# Patient Record
Sex: Male | Born: 1980
Health system: Southern US, Community
[De-identification: ages and names within clinical notes are randomized; demographics above are authoritative.]

---

## 2000-08-28 ENCOUNTER — Emergency Department (HOSPITAL_COMMUNITY): Admission: EM | Admit: 2000-08-28 | Discharge: 2000-08-28 | Payer: Self-pay | Admitting: *Deleted

## 2004-04-11 ENCOUNTER — Ambulatory Visit (HOSPITAL_COMMUNITY): Admission: RE | Admit: 2004-04-11 | Discharge: 2004-04-11 | Payer: Self-pay | Admitting: Family Medicine

## 2004-09-13 ENCOUNTER — Ambulatory Visit: Payer: Self-pay | Admitting: Family Medicine

## 2004-11-09 ENCOUNTER — Emergency Department (HOSPITAL_COMMUNITY): Admission: EM | Admit: 2004-11-09 | Discharge: 2004-11-09 | Payer: Self-pay | Admitting: Emergency Medicine

## 2005-02-18 ENCOUNTER — Emergency Department (HOSPITAL_COMMUNITY): Admission: EM | Admit: 2005-02-18 | Discharge: 2005-02-18 | Payer: Self-pay | Admitting: Emergency Medicine

## 2005-08-17 ENCOUNTER — Emergency Department (HOSPITAL_COMMUNITY): Admission: EM | Admit: 2005-08-17 | Discharge: 2005-08-17 | Payer: Self-pay | Admitting: Emergency Medicine

## 2005-10-06 ENCOUNTER — Emergency Department (HOSPITAL_COMMUNITY): Admission: EM | Admit: 2005-10-06 | Discharge: 2005-10-06 | Payer: Self-pay | Admitting: Family Medicine

## 2005-12-03 ENCOUNTER — Emergency Department (HOSPITAL_COMMUNITY): Admission: EM | Admit: 2005-12-03 | Discharge: 2005-12-03 | Payer: Self-pay | Admitting: Emergency Medicine

## 2006-03-28 ENCOUNTER — Emergency Department (HOSPITAL_COMMUNITY): Admission: EM | Admit: 2006-03-28 | Discharge: 2006-03-28 | Payer: Self-pay | Admitting: Emergency Medicine

## 2006-10-13 ENCOUNTER — Emergency Department (HOSPITAL_COMMUNITY): Admission: EM | Admit: 2006-10-13 | Discharge: 2006-10-13 | Payer: Self-pay | Admitting: Family Medicine

## 2006-10-15 ENCOUNTER — Emergency Department (HOSPITAL_COMMUNITY): Admission: EM | Admit: 2006-10-15 | Discharge: 2006-10-15 | Payer: Self-pay | Admitting: Family Medicine

## 2006-10-19 ENCOUNTER — Emergency Department (HOSPITAL_COMMUNITY): Admission: EM | Admit: 2006-10-19 | Discharge: 2006-10-19 | Payer: Self-pay | Admitting: Family Medicine

## 2006-11-12 ENCOUNTER — Emergency Department (HOSPITAL_COMMUNITY): Admission: EM | Admit: 2006-11-12 | Discharge: 2006-11-12 | Payer: Self-pay | Admitting: Family Medicine

## 2006-12-23 ENCOUNTER — Emergency Department (HOSPITAL_COMMUNITY): Admission: EM | Admit: 2006-12-23 | Discharge: 2006-12-23 | Payer: Self-pay | Admitting: Family Medicine

## 2007-08-18 ENCOUNTER — Encounter: Payer: Self-pay | Admitting: Internal Medicine

## 2010-04-10 ENCOUNTER — Emergency Department (HOSPITAL_COMMUNITY): Admission: EM | Admit: 2010-04-10 | Discharge: 2010-04-10 | Payer: Self-pay | Admitting: Emergency Medicine

## 2010-12-15 ENCOUNTER — Emergency Department (INDEPENDENT_AMBULATORY_CARE_PROVIDER_SITE_OTHER): Payer: No Typology Code available for payment source

## 2010-12-15 ENCOUNTER — Emergency Department (HOSPITAL_BASED_OUTPATIENT_CLINIC_OR_DEPARTMENT_OTHER)
Admission: EM | Admit: 2010-12-15 | Discharge: 2010-12-15 | Disposition: A | Discharge status: Home / Self Care | Payer: No Typology Code available for payment source | Attending: Emergency Medicine | Admitting: Emergency Medicine

## 2010-12-15 DIAGNOSIS — Z043 Encounter for examination and observation following other accident: Secondary | ICD-10-CM

## 2010-12-15 DIAGNOSIS — R05 Cough: Secondary | ICD-10-CM

## 2010-12-15 DIAGNOSIS — S81009A Unspecified open wound, unspecified knee, initial encounter: Secondary | ICD-10-CM | POA: Insufficient documentation

## 2010-12-15 DIAGNOSIS — R51 Headache: Secondary | ICD-10-CM | POA: Insufficient documentation

## 2010-12-15 DIAGNOSIS — Y92410 Unspecified street and highway as the place of occurrence of the external cause: Secondary | ICD-10-CM | POA: Insufficient documentation

## 2010-12-15 DIAGNOSIS — R059 Cough, unspecified: Secondary | ICD-10-CM | POA: Insufficient documentation

## 2010-12-15 DIAGNOSIS — R000 Tachycardia, unspecified: Secondary | ICD-10-CM

## 2010-12-15 DIAGNOSIS — R55 Syncope and collapse: Secondary | ICD-10-CM | POA: Insufficient documentation

## 2011-01-03 ENCOUNTER — Emergency Department (HOSPITAL_BASED_OUTPATIENT_CLINIC_OR_DEPARTMENT_OTHER)
Admission: EM | Admit: 2011-01-03 | Discharge: 2011-01-03 | Disposition: A | Discharge status: Home / Self Care | Payer: No Typology Code available for payment source | Attending: Emergency Medicine | Admitting: Emergency Medicine

## 2011-01-03 DIAGNOSIS — Z4802 Encounter for removal of sutures: Secondary | ICD-10-CM | POA: Insufficient documentation

## 2011-05-28 ENCOUNTER — Emergency Department (HOSPITAL_BASED_OUTPATIENT_CLINIC_OR_DEPARTMENT_OTHER)
Admission: EM | Admit: 2011-05-28 | Discharge: 2011-05-28 | Disposition: A | Discharge status: Home / Self Care | Payer: No Typology Code available for payment source | Attending: Emergency Medicine | Admitting: Emergency Medicine

## 2011-05-28 ENCOUNTER — Encounter: Payer: Self-pay | Admitting: *Deleted

## 2011-05-28 DIAGNOSIS — M545 Low back pain, unspecified: Secondary | ICD-10-CM | POA: Insufficient documentation

## 2011-05-28 DIAGNOSIS — Y92410 Unspecified street and highway as the place of occurrence of the external cause: Secondary | ICD-10-CM | POA: Insufficient documentation

## 2011-05-28 DIAGNOSIS — S39012A Strain of muscle, fascia and tendon of lower back, initial encounter: Secondary | ICD-10-CM

## 2011-05-28 MED ORDER — CYCLOBENZAPRINE HCL 10 MG PO TABS: 10.0000 mg | ORAL_TABLET | Freq: Two times a day (BID) | ORAL | Status: AC | PRN

## 2011-05-28 NOTE — ED Provider Notes (Signed)
Medical screening examination/treatment/procedure(s) were performed by non-physician practitioner and as supervising physician I was immediately available for consultation/collaboration.   Tedi Hughson B. Bernette Mayers, MD 05/28/11 1815
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
## 2014-12-08 ENCOUNTER — Emergency Department (HOSPITAL_BASED_OUTPATIENT_CLINIC_OR_DEPARTMENT_OTHER)
Admission: EM | Admit: 2014-12-08 | Discharge: 2014-12-09 | Disposition: A | Discharge status: Home / Self Care | Payer: Self-pay | Attending: Emergency Medicine | Admitting: Emergency Medicine

## 2014-12-08 ENCOUNTER — Encounter (HOSPITAL_BASED_OUTPATIENT_CLINIC_OR_DEPARTMENT_OTHER): Payer: Self-pay

## 2014-12-08 DIAGNOSIS — S4991XA Unspecified injury of right shoulder and upper arm, initial encounter: Secondary | ICD-10-CM | POA: Insufficient documentation

## 2014-12-08 DIAGNOSIS — S3992XA Unspecified injury of lower back, initial encounter: Secondary | ICD-10-CM | POA: Insufficient documentation

## 2014-12-08 DIAGNOSIS — Y92410 Unspecified street and highway as the place of occurrence of the external cause: Secondary | ICD-10-CM | POA: Insufficient documentation

## 2014-12-08 DIAGNOSIS — Y998 Other external cause status: Secondary | ICD-10-CM | POA: Insufficient documentation

## 2014-12-08 DIAGNOSIS — Y9389 Activity, other specified: Secondary | ICD-10-CM | POA: Insufficient documentation
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
## 2014-12-09 MED ORDER — HYDROCODONE-ACETAMINOPHEN 5-325 MG PO TABS: 1.0000 | ORAL_TABLET | Freq: Four times a day (QID) | ORAL | Status: DC | PRN
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
## 2014-12-09 NOTE — Discharge Instructions (Signed)

## 2015-02-13 ENCOUNTER — Emergency Department (HOSPITAL_BASED_OUTPATIENT_CLINIC_OR_DEPARTMENT_OTHER)
Admission: EM | Admit: 2015-02-13 | Discharge: 2015-02-13 | Disposition: A | Discharge status: Home / Self Care | Payer: No Typology Code available for payment source | Attending: Emergency Medicine | Admitting: Emergency Medicine

## 2015-02-13 ENCOUNTER — Encounter (HOSPITAL_BASED_OUTPATIENT_CLINIC_OR_DEPARTMENT_OTHER): Payer: Self-pay | Admitting: Emergency Medicine

## 2015-02-13 ENCOUNTER — Emergency Department (HOSPITAL_BASED_OUTPATIENT_CLINIC_OR_DEPARTMENT_OTHER): Payer: No Typology Code available for payment source

## 2015-02-13 DIAGNOSIS — S6992XA Unspecified injury of left wrist, hand and finger(s), initial encounter: Secondary | ICD-10-CM | POA: Insufficient documentation

## 2015-02-13 DIAGNOSIS — M79642 Pain in left hand: Secondary | ICD-10-CM

## 2015-02-13 DIAGNOSIS — Y9389 Activity, other specified: Secondary | ICD-10-CM | POA: Diagnosis not present

## 2015-02-13 DIAGNOSIS — Y92410 Unspecified street and highway as the place of occurrence of the external cause: Secondary | ICD-10-CM | POA: Diagnosis not present

## 2015-02-13 DIAGNOSIS — Y998 Other external cause status: Secondary | ICD-10-CM | POA: Diagnosis not present

## 2015-02-13 MED ORDER — IBUPROFEN 600 MG PO TABS: 600.0000 mg | ORAL_TABLET | Freq: Four times a day (QID) | ORAL | Status: DC | PRN

## 2015-02-13 NOTE — Discharge Instructions (Signed)
RICE: Routine Care for Injuries The routine care of many injuries includes Rest, Ice, Compression, and Elevation (RICE). HOME CARE INSTRUCTIONS  Rest is needed to allow your body to heal. Routine activities can usually be resumed when comfortable. Injured tendons and bones can take up to 6 weeks to heal. Tendons are the cord-like structures that attach muscle to bone.  Ice following an injury helps keep the swelling down and reduces pain.  Put ice in a plastic bag.  Place a towel between your skin and the bag.  Leave the ice on for 15-20 minutes, 3-4 times a day, or as directed by your health care provider. Do this while awake, for the first 24 to 48 hours. After that, continue as directed by your caregiver.  Compression helps keep swelling down. It also gives support and helps with discomfort. If an elastic bandage has been applied, it should be removed and reapplied every 3 to 4 hours. It should not be applied tightly, but firmly enough to keep swelling down. Watch fingers or toes for swelling, bluish discoloration, coldness, numbness, or excessive pain. If any of these problems occur, remove the bandage and reapply loosely. Contact your caregiver if these problems continue.  Elevation helps reduce swelling and decreases pain. With extremities, such as the arms, hands, legs, and feet, the injured area should be placed near or above the level of the heart, if possible. SEEK IMMEDIATE MEDICAL CARE IF:  You have persistent pain and swelling.  You develop redness, numbness, or unexpected weakness.  Your symptoms are getting worse rather than improving after several days. These symptoms may indicate that further evaluation or further X-rays are needed. Sometimes, X-rays may not show a small broken bone (fracture) until 1 week or 10 days later. Make a follow-up appointment with your caregiver. Ask when your X-ray results will be ready. Make sure you get your X-ray results. Document Released:  12/07/2000 Document Revised: 08/30/2013 Document Reviewed: 01/24/2011 Exit[employer] Patient Information 2015 [employer]. This information is not intended to replace advice given to you by your health care provider. Make sure you discuss any questions you have with your health care provider.  
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
## 2016-12-19 ENCOUNTER — Emergency Department (HOSPITAL_BASED_OUTPATIENT_CLINIC_OR_DEPARTMENT_OTHER)
Admission: EM | Admit: 2016-12-19 | Discharge: 2016-12-19 | Disposition: A | Discharge status: Home / Self Care | Payer: No Typology Code available for payment source | Attending: Emergency Medicine | Admitting: Emergency Medicine

## 2016-12-19 ENCOUNTER — Encounter (HOSPITAL_BASED_OUTPATIENT_CLINIC_OR_DEPARTMENT_OTHER): Payer: Self-pay | Admitting: Emergency Medicine

## 2016-12-19 DIAGNOSIS — M62830 Muscle spasm of back: Secondary | ICD-10-CM

## 2016-12-19 DIAGNOSIS — Y92410 Unspecified street and highway as the place of occurrence of the external cause: Secondary | ICD-10-CM | POA: Insufficient documentation

## 2016-12-19 DIAGNOSIS — Y999 Unspecified external cause status: Secondary | ICD-10-CM | POA: Insufficient documentation

## 2016-12-19 DIAGNOSIS — S3992XA Unspecified injury of lower back, initial encounter: Secondary | ICD-10-CM | POA: Diagnosis present

## 2016-12-19 DIAGNOSIS — M545 Low back pain, unspecified: Secondary | ICD-10-CM

## 2016-12-19 DIAGNOSIS — Y939 Activity, unspecified: Secondary | ICD-10-CM | POA: Diagnosis not present

## 2016-12-19 DIAGNOSIS — S39012A Strain of muscle, fascia and tendon of lower back, initial encounter: Secondary | ICD-10-CM | POA: Insufficient documentation

## 2016-12-19 MED ORDER — CYCLOBENZAPRINE HCL 10 MG PO TABS: 10.0000 mg | ORAL_TABLET | Freq: Three times a day (TID) | ORAL | 0 refills | Status: AC | PRN

## 2016-12-19 MED ORDER — NAPROXEN 500 MG PO TABS: 500.0000 mg | ORAL_TABLET | Freq: Two times a day (BID) | ORAL | 0 refills | Status: DC | PRN

## 2016-12-19 MED FILL — NAPROXEN 500 MG TABLET: 500 | 10 days supply | Qty: 20 | Fill #0

## 2016-12-19 MED FILL — CYCLOBENZAPRINE 10 MG TAB: 10 | 5 days supply | Qty: 15 | Fill #0
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
## 2016-12-19 NOTE — Discharge Instructions (Signed)
Take naprosyn as directed for inflammation and pain with tylenol for breakthrough pain and flexeril for muscle relaxation. Do not drive or operate machinery with muscle relaxant use. Use heat to areas of soreness, no more than 20 minutes at a time every hour. Expect to be sore for the next few days and follow up with primary care physician for recheck of ongoing symptoms in the next 1-2 weeks. Return to ER for emergent changing or worsening of symptoms.  °  °
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
## 2016-12-19 NOTE — ED Notes (Signed)
ED Provider at bedside. 

## 2018-05-09 ENCOUNTER — Emergency Department (HOSPITAL_COMMUNITY)
Admission: EM | Admit: 2018-05-09 | Discharge: 2018-05-09 | Disposition: A | Discharge status: Home / Self Care | Payer: No Typology Code available for payment source | Attending: Emergency Medicine | Admitting: Emergency Medicine

## 2018-05-09 ENCOUNTER — Emergency Department (HOSPITAL_COMMUNITY): Payer: No Typology Code available for payment source

## 2018-05-09 ENCOUNTER — Encounter (HOSPITAL_COMMUNITY): Payer: Self-pay | Admitting: Emergency Medicine

## 2018-05-09 ENCOUNTER — Other Ambulatory Visit: Payer: Self-pay

## 2018-05-09 DIAGNOSIS — Y929 Unspecified place or not applicable: Secondary | ICD-10-CM | POA: Insufficient documentation

## 2018-05-09 DIAGNOSIS — Y939 Activity, unspecified: Secondary | ICD-10-CM | POA: Diagnosis not present

## 2018-05-09 DIAGNOSIS — Y999 Unspecified external cause status: Secondary | ICD-10-CM | POA: Diagnosis not present

## 2018-05-09 DIAGNOSIS — M25511 Pain in right shoulder: Secondary | ICD-10-CM | POA: Insufficient documentation

## 2018-05-09 DIAGNOSIS — M542 Cervicalgia: Secondary | ICD-10-CM | POA: Insufficient documentation

## 2018-05-09 DIAGNOSIS — M79661 Pain in right lower leg: Secondary | ICD-10-CM | POA: Insufficient documentation

## 2018-05-09 DIAGNOSIS — M7918 Myalgia, other site: Secondary | ICD-10-CM

## 2018-05-09 MED ORDER — METHOCARBAMOL 500 MG PO TABS: 500.0000 mg | ORAL_TABLET | Freq: Two times a day (BID) | ORAL | 0 refills | Status: AC

## 2018-05-09 MED ORDER — NAPROXEN 500 MG PO TABS
500.0000 mg | ORAL_TABLET | Freq: Two times a day (BID) | ORAL | 0 refills | Status: AC
Start: 2018-05-09 — End: ?

## 2018-05-09 MED ORDER — NAPROXEN 250 MG PO TABS
500.0000 mg | ORAL_TABLET | Freq: Once | ORAL | Status: AC
  Administered 2018-05-09: 500 mg via ORAL
  Filled 2018-05-09: qty 2
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
## 2018-05-09 NOTE — ED Notes (Signed)
E-signature not available, pt verbalized understanding of DC instructions and prescriptions 

## 2018-05-09 NOTE — ED Notes (Signed)
Pt refused C collar placement.
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
